# Patient Record
Sex: Male | Born: 1975 | Race: White | Hispanic: No | Marital: Married | Smoking: Never smoker
Health system: Southern US, Community
[De-identification: ages and names within clinical notes are randomized; demographics above are authoritative.]

## PROBLEM LIST (undated history)

## (undated) DIAGNOSIS — K219 Gastro-esophageal reflux disease without esophagitis: Secondary | ICD-10-CM

## (undated) DIAGNOSIS — M109 Gout, unspecified: Secondary | ICD-10-CM

## (undated) HISTORY — PX: VASECTOMY: SHX75

---

## 2016-09-05 DIAGNOSIS — M25511 Pain in right shoulder: Secondary | ICD-10-CM | POA: Diagnosis not present

## 2016-09-05 DIAGNOSIS — M545 Low back pain: Secondary | ICD-10-CM | POA: Diagnosis not present

## 2016-09-05 DIAGNOSIS — M546 Pain in thoracic spine: Secondary | ICD-10-CM | POA: Diagnosis not present

## 2016-09-05 DIAGNOSIS — E669 Obesity, unspecified: Secondary | ICD-10-CM | POA: Diagnosis not present

## 2016-10-03 DIAGNOSIS — M25411 Effusion, right shoulder: Secondary | ICD-10-CM | POA: Diagnosis not present

## 2016-10-03 DIAGNOSIS — M25511 Pain in right shoulder: Secondary | ICD-10-CM | POA: Diagnosis not present

## 2016-12-12 DIAGNOSIS — M2061 Acquired deformities of toe(s), unspecified, right foot: Secondary | ICD-10-CM | POA: Diagnosis not present

## 2016-12-12 DIAGNOSIS — M79674 Pain in right toe(s): Secondary | ICD-10-CM | POA: Diagnosis not present

## 2017-01-23 DIAGNOSIS — K029 Dental caries, unspecified: Secondary | ICD-10-CM | POA: Diagnosis not present

## 2017-03-06 DIAGNOSIS — F17290 Nicotine dependence, other tobacco product, uncomplicated: Secondary | ICD-10-CM | POA: Diagnosis not present

## 2017-03-06 DIAGNOSIS — M546 Pain in thoracic spine: Secondary | ICD-10-CM | POA: Diagnosis not present

## 2017-03-06 DIAGNOSIS — M25512 Pain in left shoulder: Secondary | ICD-10-CM | POA: Diagnosis not present

## 2017-03-06 DIAGNOSIS — M545 Low back pain: Secondary | ICD-10-CM | POA: Diagnosis not present

## 2017-04-20 DIAGNOSIS — R05 Cough: Secondary | ICD-10-CM | POA: Diagnosis not present

## 2017-04-20 DIAGNOSIS — R509 Fever, unspecified: Secondary | ICD-10-CM | POA: Diagnosis not present

## 2017-04-20 DIAGNOSIS — J018 Other acute sinusitis: Secondary | ICD-10-CM | POA: Diagnosis not present

## 2017-04-20 DIAGNOSIS — J069 Acute upper respiratory infection, unspecified: Secondary | ICD-10-CM | POA: Diagnosis not present

## 2017-05-01 DIAGNOSIS — M545 Low back pain: Secondary | ICD-10-CM | POA: Diagnosis not present

## 2017-05-01 DIAGNOSIS — G8929 Other chronic pain: Secondary | ICD-10-CM | POA: Diagnosis not present

## 2017-05-29 DIAGNOSIS — M545 Low back pain: Secondary | ICD-10-CM | POA: Diagnosis not present

## 2017-06-12 DIAGNOSIS — M2560 Stiffness of unspecified joint, not elsewhere classified: Secondary | ICD-10-CM | POA: Diagnosis not present

## 2017-06-12 DIAGNOSIS — M545 Low back pain: Secondary | ICD-10-CM | POA: Diagnosis not present

## 2017-06-15 DIAGNOSIS — M109 Gout, unspecified: Secondary | ICD-10-CM | POA: Diagnosis not present

## 2017-06-25 DIAGNOSIS — M6281 Muscle weakness (generalized): Secondary | ICD-10-CM | POA: Diagnosis not present

## 2017-06-25 DIAGNOSIS — M545 Low back pain: Secondary | ICD-10-CM | POA: Diagnosis not present

## 2017-07-20 DIAGNOSIS — K0889 Other specified disorders of teeth and supporting structures: Secondary | ICD-10-CM | POA: Diagnosis not present

## 2017-09-04 DIAGNOSIS — M25512 Pain in left shoulder: Secondary | ICD-10-CM | POA: Diagnosis not present

## 2017-09-04 DIAGNOSIS — Z832 Family history of diseases of the blood and blood-forming organs and certain disorders involving the immune mechanism: Secondary | ICD-10-CM | POA: Diagnosis not present

## 2017-09-04 DIAGNOSIS — Z1211 Encounter for screening for malignant neoplasm of colon: Secondary | ICD-10-CM | POA: Diagnosis not present

## 2017-09-04 DIAGNOSIS — M109 Gout, unspecified: Secondary | ICD-10-CM | POA: Diagnosis not present

## 2017-09-04 DIAGNOSIS — F4310 Post-traumatic stress disorder, unspecified: Secondary | ICD-10-CM | POA: Diagnosis not present

## 2018-01-14 DIAGNOSIS — K047 Periapical abscess without sinus: Secondary | ICD-10-CM | POA: Diagnosis not present

## 2018-03-03 DIAGNOSIS — Z113 Encounter for screening for infections with a predominantly sexual mode of transmission: Secondary | ICD-10-CM | POA: Diagnosis not present

## 2018-03-25 DIAGNOSIS — M25511 Pain in right shoulder: Secondary | ICD-10-CM | POA: Diagnosis not present

## 2018-03-25 DIAGNOSIS — Z832 Family history of diseases of the blood and blood-forming organs and certain disorders involving the immune mechanism: Secondary | ICD-10-CM | POA: Diagnosis not present

## 2018-03-25 DIAGNOSIS — Z9852 Vasectomy status: Secondary | ICD-10-CM | POA: Diagnosis not present

## 2018-03-25 DIAGNOSIS — Z3141 Encounter for fertility testing: Secondary | ICD-10-CM | POA: Diagnosis not present

## 2018-03-30 DIAGNOSIS — M25511 Pain in right shoulder: Secondary | ICD-10-CM | POA: Diagnosis not present

## 2018-03-30 DIAGNOSIS — Z832 Family history of diseases of the blood and blood-forming organs and certain disorders involving the immune mechanism: Secondary | ICD-10-CM | POA: Diagnosis not present

## 2018-04-09 DIAGNOSIS — H47233 Glaucomatous optic atrophy, bilateral: Secondary | ICD-10-CM | POA: Diagnosis not present

## 2018-04-09 DIAGNOSIS — H527 Unspecified disorder of refraction: Secondary | ICD-10-CM | POA: Diagnosis not present

## 2018-05-12 DIAGNOSIS — M7551 Bursitis of right shoulder: Secondary | ICD-10-CM | POA: Diagnosis not present

## 2018-05-12 DIAGNOSIS — M7541 Impingement syndrome of right shoulder: Secondary | ICD-10-CM | POA: Diagnosis not present

## 2018-06-07 DIAGNOSIS — H524 Presbyopia: Secondary | ICD-10-CM | POA: Diagnosis not present

## 2018-06-14 DIAGNOSIS — F4310 Post-traumatic stress disorder, unspecified: Secondary | ICD-10-CM | POA: Diagnosis not present

## 2018-06-21 DIAGNOSIS — R918 Other nonspecific abnormal finding of lung field: Secondary | ICD-10-CM | POA: Diagnosis not present

## 2018-06-21 DIAGNOSIS — E8801 Alpha-1-antitrypsin deficiency: Secondary | ICD-10-CM | POA: Diagnosis not present

## 2018-08-10 DIAGNOSIS — L247 Irritant contact dermatitis due to plants, except food: Secondary | ICD-10-CM | POA: Diagnosis not present

## 2018-09-03 DIAGNOSIS — M25512 Pain in left shoulder: Secondary | ICD-10-CM | POA: Diagnosis not present

## 2018-09-03 DIAGNOSIS — R918 Other nonspecific abnormal finding of lung field: Secondary | ICD-10-CM | POA: Diagnosis not present

## 2018-09-03 DIAGNOSIS — K219 Gastro-esophageal reflux disease without esophagitis: Secondary | ICD-10-CM | POA: Diagnosis not present

## 2018-09-03 DIAGNOSIS — E8801 Alpha-1-antitrypsin deficiency: Secondary | ICD-10-CM | POA: Diagnosis not present

## 2018-11-30 DIAGNOSIS — M109 Gout, unspecified: Secondary | ICD-10-CM | POA: Diagnosis not present

## 2019-01-05 DIAGNOSIS — H9313 Tinnitus, bilateral: Secondary | ICD-10-CM | POA: Diagnosis not present

## 2019-01-18 ENCOUNTER — Encounter (HOSPITAL_COMMUNITY): Payer: Self-pay | Admitting: Emergency Medicine

## 2019-01-18 ENCOUNTER — Emergency Department (HOSPITAL_COMMUNITY): Payer: No Typology Code available for payment source

## 2019-01-18 ENCOUNTER — Other Ambulatory Visit: Payer: Self-pay

## 2019-01-18 ENCOUNTER — Emergency Department (HOSPITAL_COMMUNITY)
Admission: EM | Admit: 2019-01-18 | Discharge: 2019-01-18 | Disposition: A | Discharge status: Home / Self Care | Payer: No Typology Code available for payment source | Attending: Emergency Medicine | Admitting: Emergency Medicine

## 2019-01-18 DIAGNOSIS — Z87891 Personal history of nicotine dependence: Secondary | ICD-10-CM | POA: Insufficient documentation

## 2019-01-18 DIAGNOSIS — R109 Unspecified abdominal pain: Secondary | ICD-10-CM | POA: Diagnosis present

## 2019-01-18 DIAGNOSIS — N200 Calculus of kidney: Secondary | ICD-10-CM | POA: Diagnosis not present

## 2019-01-18 DIAGNOSIS — N132 Hydronephrosis with renal and ureteral calculous obstruction: Secondary | ICD-10-CM | POA: Diagnosis not present

## 2019-01-18 LAB — URINALYSIS, ROUTINE W REFLEX MICROSCOPIC
Bacteria, UA: NONE SEEN
Bilirubin Urine: NEGATIVE
Glucose, UA: NEGATIVE mg/dL
Ketones, ur: NEGATIVE mg/dL
Leukocytes,Ua: NEGATIVE
Nitrite: NEGATIVE
Protein, ur: NEGATIVE mg/dL
Specific Gravity, Urine: 1.017 (ref 1.005–1.030)
pH: 6 (ref 5.0–8.0)

## 2019-01-18 LAB — CBC WITH DIFFERENTIAL/PLATELET
Abs Immature Granulocytes: 0.07 10*3/uL (ref 0.00–0.07)
Basophils Absolute: 0.1 10*3/uL (ref 0.0–0.1)
Basophils Relative: 1 %
Eosinophils Absolute: 0 10*3/uL (ref 0.0–0.5)
Eosinophils Relative: 0 %
HCT: 44.8 % (ref 39.0–52.0)
Hemoglobin: 15 g/dL (ref 13.0–17.0)
Immature Granulocytes: 1 %
Lymphocytes Relative: 9 %
Lymphs Abs: 1 10*3/uL (ref 0.7–4.0)
MCH: 28 pg (ref 26.0–34.0)
MCHC: 33.5 g/dL (ref 30.0–36.0)
MCV: 83.7 fL (ref 80.0–100.0)
Monocytes Absolute: 0.5 10*3/uL (ref 0.1–1.0)
Monocytes Relative: 5 %
Neutro Abs: 9.6 10*3/uL — ABNORMAL HIGH (ref 1.7–7.7)
Neutrophils Relative %: 84 %
Platelets: 220 10*3/uL (ref 150–400)
RBC: 5.35 MIL/uL (ref 4.22–5.81)
RDW: 12.2 % (ref 11.5–15.5)
WBC: 11.3 10*3/uL — ABNORMAL HIGH (ref 4.0–10.5)
nRBC: 0 % (ref 0.0–0.2)

## 2019-01-18 LAB — BASIC METABOLIC PANEL
Anion gap: 9 (ref 5–15)
BUN: 14 mg/dL (ref 6–20)
CO2: 22 mmol/L (ref 22–32)
Calcium: 9.1 mg/dL (ref 8.9–10.3)
Chloride: 108 mmol/L (ref 98–111)
Creatinine, Ser: 1.36 mg/dL — ABNORMAL HIGH (ref 0.61–1.24)
GFR calc Af Amer: >60 mL/min (ref >60)
GFR calc non Af Amer: >60 mL/min (ref >60)
Glucose, Bld: 114 mg/dL — ABNORMAL HIGH (ref 70–99)
Potassium: 4.2 mmol/L (ref 3.5–5.1)
Sodium: 139 mmol/L (ref 135–145)

## 2019-01-18 MED ORDER — KETOROLAC TROMETHAMINE 15 MG/ML IJ SOLN
15.0000 mg | Freq: Once | INTRAMUSCULAR | Status: AC
  Administered 2019-01-18: 16:00:00 15 mg via INTRAVENOUS
  Filled 2019-01-18: qty 1

## 2019-01-18 MED ORDER — MORPHINE SULFATE (PF) 4 MG/ML IV SOLN
4.0000 mg | Freq: Once | INTRAVENOUS | Status: AC
  Administered 2019-01-18: 14:00:00 4 mg via INTRAVENOUS
  Filled 2019-01-18: qty 1

## 2019-01-18 MED ORDER — OXYCODONE-ACETAMINOPHEN 5-325 MG PO TABS: 1.0000 | ORAL_TABLET | ORAL | 0 refills | Status: DC | PRN

## 2019-01-18 MED ORDER — ONDANSETRON HCL 4 MG/2ML IJ SOLN
4.0000 mg | Freq: Once | INTRAMUSCULAR | Status: AC
  Administered 2019-01-18: 4 mg via INTRAVENOUS
  Filled 2019-01-18: qty 2

## 2019-01-18 NOTE — ED Provider Notes (Signed)
Patient signed to me by Dr. Maryan Rued pending results and shows left-sided stone.  Patient's pain is controlled this time.  Will discharge home and give urology referral   Lacretia Leigh, MD 01/18/19 [phone]

## 2019-01-18 NOTE — ED Provider Notes (Signed)
[facility] DEPT Provider Note   CSN: [id number] Arrival date & time: 01/18/19  1059     History   Chief Complaint No chief complaint on file.   HPI Randall Sanders is a 43 y.o. male.     The history is provided by the patient.  Flank Pain This is a new problem. The current episode started 3 to 5 hours ago. The problem occurs constantly. The problem has not changed since onset.Associated symptoms include abdominal pain. Associated symptoms comments: Left flank pain that started suddenly at 9:30 and radiates around to the groin.  Dry heaving but no fever, vomiting or urinary complaints.  No resp complaints.  No hx of prior . Nothing aggravates the symptoms. Nothing relieves the symptoms. He has tried nothing for the symptoms. The treatment provided no relief.    History reviewed. No pertinent past medical history.  There are no active problems to display for this patient.   Past Surgical History:  Procedure Laterality Date  . VASECTOMY          Home Medications    Prior to Admission medications   Not on File    Family History No family history on file.  Social History Social History   Tobacco Use  . Smoking status: Not on file  . Smokeless tobacco: Former Network engineer Use Topics  . Alcohol use: Not Currently  . Drug use: Not on file     Allergies   Patient has no known allergies.   Review of Systems Review of Systems  Gastrointestinal: Positive for abdominal pain.  Genitourinary: Positive for flank pain.  All other systems reviewed and are negative.    Physical Exam Updated Vital Signs BP 131/62   Pulse 60   Temp 97.9 F (36.6 C) (Oral)   Resp (!) 24   SpO2 100%   Physical Exam Vitals signs and nursing note reviewed.  Constitutional:      General: He is not in acute distress.    Appearance: He is well-developed.  HENT:     Head: Normocephalic and atraumatic.  Eyes:     Conjunctiva/sclera:  Conjunctivae normal.     Pupils: Pupils are equal, round, and reactive to light.  Neck:     Musculoskeletal: Normal range of motion and neck supple.  Cardiovascular:     Rate and Rhythm: Normal rate and regular rhythm.     Heart sounds: No murmur.  Pulmonary:     Effort: Pulmonary effort is normal. No respiratory distress.     Breath sounds: Normal breath sounds. No wheezing or rales.  Abdominal:     General: There is no distension.     Palpations: Abdomen is soft.     Tenderness: There is no abdominal tenderness. There is no right CVA tenderness, left CVA tenderness, guarding or rebound.  Musculoskeletal: Normal range of motion.        General: No tenderness.  Skin:    General: Skin is warm and dry.     Findings: No erythema or rash.  Neurological:     General: No focal deficit present.     Mental Status: He is alert and oriented to person, place, and time. Mental status is at baseline.  Psychiatric:        Mood and Affect: Mood normal.        Behavior: Behavior normal.        Thought Content: Thought content normal.      ED Treatments / Results  Labs (all labs ordered are listed, but only abnormal results are displayed) Labs Reviewed  CBC WITH DIFFERENTIAL/PLATELET - Abnormal; Notable for the following components:      Result Value   WBC 11.3 (*)    Neutro Abs 9.6 (*)    All other components within normal limits  BASIC METABOLIC PANEL - Abnormal; Notable for the following components:   Glucose, Bld 114 (*)    Creatinine, Ser 1.36 (*)    All other components within normal limits  URINALYSIS, ROUTINE W REFLEX MICROSCOPIC    EKG None  Radiology No results found.  Procedures Procedures (including critical care time)  Medications Ordered in ED Medications  morphine 4 MG/ML injection 4 mg (has no administration in time range)  ondansetron (ZOFRAN) injection 4 mg (has no administration in time range)     Initial Impression / Assessment and Plan / ED Course  I  have reviewed the triage vital signs and the nursing notes.  Pertinent labs & imaging results that were available during my care of the patient were reviewed by me and considered in my medical decision making (see chart for details).        Pt with symptoms consistent with kidney stone.  Denies infectious sx, or GI symptoms.  Low concern for diverticulitis and no risk factors or history suggestive of AAA.  No hx suggestive of GU source (discharge) and otherwise pt is healthy.  Will hydrate, treat pain and ensure no infection with UA, CBC, BMP and will get stone study to further eval.    Final Clinical Impressions(s) / ED Diagnoses   Final diagnoses:  None    ED Discharge Orders    None       Gwyneth Sprout, MD 01/18/19 1545

## 2019-01-18 NOTE — ED Notes (Signed)
Patient transported to CT 

## 2019-01-18 NOTE — ED Notes (Signed)
Pt c/o flank pains

## 2019-02-07 DIAGNOSIS — R112 Nausea with vomiting, unspecified: Secondary | ICD-10-CM | POA: Diagnosis not present

## 2019-02-07 DIAGNOSIS — N132 Hydronephrosis with renal and ureteral calculous obstruction: Secondary | ICD-10-CM | POA: Diagnosis not present

## 2019-02-10 DIAGNOSIS — R918 Other nonspecific abnormal finding of lung field: Secondary | ICD-10-CM | POA: Diagnosis not present

## 2019-03-26 DIAGNOSIS — N400 Enlarged prostate without lower urinary tract symptoms: Secondary | ICD-10-CM | POA: Diagnosis not present

## 2019-03-26 DIAGNOSIS — R1032 Left lower quadrant pain: Secondary | ICD-10-CM | POA: Diagnosis not present

## 2019-03-26 DIAGNOSIS — R1012 Left upper quadrant pain: Secondary | ICD-10-CM | POA: Diagnosis not present

## 2019-03-26 DIAGNOSIS — N132 Hydronephrosis with renal and ureteral calculous obstruction: Secondary | ICD-10-CM | POA: Diagnosis not present

## 2019-03-26 DIAGNOSIS — N200 Calculus of kidney: Secondary | ICD-10-CM | POA: Diagnosis not present

## 2019-05-26 DIAGNOSIS — R110 Nausea: Secondary | ICD-10-CM | POA: Diagnosis not present

## 2019-05-26 DIAGNOSIS — N200 Calculus of kidney: Secondary | ICD-10-CM | POA: Diagnosis not present

## 2019-05-26 DIAGNOSIS — R109 Unspecified abdominal pain: Secondary | ICD-10-CM | POA: Diagnosis not present

## 2019-05-30 DIAGNOSIS — N200 Calculus of kidney: Secondary | ICD-10-CM | POA: Diagnosis not present

## 2019-05-30 DIAGNOSIS — R109 Unspecified abdominal pain: Secondary | ICD-10-CM | POA: Diagnosis not present

## 2019-05-31 DIAGNOSIS — N200 Calculus of kidney: Secondary | ICD-10-CM | POA: Diagnosis not present

## 2019-06-03 DIAGNOSIS — N201 Calculus of ureter: Secondary | ICD-10-CM | POA: Diagnosis not present

## 2019-06-21 DIAGNOSIS — N200 Calculus of kidney: Secondary | ICD-10-CM | POA: Diagnosis not present

## 2019-09-02 DIAGNOSIS — M109 Gout, unspecified: Secondary | ICD-10-CM | POA: Diagnosis not present

## 2019-10-11 DIAGNOSIS — R918 Other nonspecific abnormal finding of lung field: Secondary | ICD-10-CM | POA: Diagnosis not present

## 2019-10-11 DIAGNOSIS — M25512 Pain in left shoulder: Secondary | ICD-10-CM | POA: Diagnosis not present

## 2019-10-11 DIAGNOSIS — E8801 Alpha-1-antitrypsin deficiency: Secondary | ICD-10-CM | POA: Diagnosis not present

## 2019-10-11 DIAGNOSIS — R6882 Decreased libido: Secondary | ICD-10-CM | POA: Diagnosis not present

## 2019-10-21 DIAGNOSIS — Z13220 Encounter for screening for lipoid disorders: Secondary | ICD-10-CM | POA: Diagnosis not present

## 2019-10-21 DIAGNOSIS — M109 Gout, unspecified: Secondary | ICD-10-CM | POA: Diagnosis not present

## 2019-10-21 DIAGNOSIS — Z131 Encounter for screening for diabetes mellitus: Secondary | ICD-10-CM | POA: Diagnosis not present

## 2019-10-21 DIAGNOSIS — R6882 Decreased libido: Secondary | ICD-10-CM | POA: Diagnosis not present

## 2019-11-01 DIAGNOSIS — R109 Unspecified abdominal pain: Secondary | ICD-10-CM | POA: Diagnosis not present

## 2019-11-01 DIAGNOSIS — Z79899 Other long term (current) drug therapy: Secondary | ICD-10-CM | POA: Diagnosis not present

## 2019-11-01 DIAGNOSIS — K4090 Unilateral inguinal hernia, without obstruction or gangrene, not specified as recurrent: Secondary | ICD-10-CM | POA: Diagnosis not present

## 2019-11-01 DIAGNOSIS — Z87891 Personal history of nicotine dependence: Secondary | ICD-10-CM | POA: Diagnosis not present

## 2019-11-01 DIAGNOSIS — N201 Calculus of ureter: Secondary | ICD-10-CM | POA: Diagnosis not present

## 2019-11-01 DIAGNOSIS — N132 Hydronephrosis with renal and ureteral calculous obstruction: Secondary | ICD-10-CM | POA: Diagnosis not present

## 2019-11-01 DIAGNOSIS — Z887 Allergy status to serum and vaccine status: Secondary | ICD-10-CM | POA: Diagnosis not present

## 2019-12-29 DIAGNOSIS — M7989 Other specified soft tissue disorders: Secondary | ICD-10-CM | POA: Diagnosis not present

## 2019-12-29 DIAGNOSIS — M79671 Pain in right foot: Secondary | ICD-10-CM | POA: Diagnosis not present

## 2020-01-05 DIAGNOSIS — M79671 Pain in right foot: Secondary | ICD-10-CM | POA: Diagnosis not present

## 2020-01-30 DIAGNOSIS — M79671 Pain in right foot: Secondary | ICD-10-CM | POA: Diagnosis not present

## 2020-02-01 DIAGNOSIS — M79671 Pain in right foot: Secondary | ICD-10-CM | POA: Diagnosis not present

## 2020-02-06 DIAGNOSIS — M79671 Pain in right foot: Secondary | ICD-10-CM | POA: Diagnosis not present

## 2020-03-08 DIAGNOSIS — R918 Other nonspecific abnormal finding of lung field: Secondary | ICD-10-CM | POA: Diagnosis not present

## 2020-03-13 DIAGNOSIS — M79671 Pain in right foot: Secondary | ICD-10-CM | POA: Diagnosis not present

## 2020-03-22 DIAGNOSIS — M79671 Pain in right foot: Secondary | ICD-10-CM | POA: Diagnosis not present

## 2020-04-12 DIAGNOSIS — M79671 Pain in right foot: Secondary | ICD-10-CM | POA: Diagnosis not present

## 2020-04-12 DIAGNOSIS — H6123 Impacted cerumen, bilateral: Secondary | ICD-10-CM | POA: Diagnosis not present

## 2020-04-12 DIAGNOSIS — K219 Gastro-esophageal reflux disease without esophagitis: Secondary | ICD-10-CM | POA: Diagnosis not present

## 2020-04-12 DIAGNOSIS — K30 Functional dyspepsia: Secondary | ICD-10-CM | POA: Diagnosis not present

## 2020-05-20 DIAGNOSIS — N200 Calculus of kidney: Secondary | ICD-10-CM | POA: Diagnosis not present

## 2020-05-22 DIAGNOSIS — R1032 Left lower quadrant pain: Secondary | ICD-10-CM | POA: Diagnosis not present

## 2020-05-22 DIAGNOSIS — N201 Calculus of ureter: Secondary | ICD-10-CM | POA: Diagnosis not present

## 2020-05-22 DIAGNOSIS — R1084 Generalized abdominal pain: Secondary | ICD-10-CM | POA: Diagnosis not present

## 2020-05-22 DIAGNOSIS — N1330 Unspecified hydronephrosis: Secondary | ICD-10-CM | POA: Diagnosis not present

## 2020-05-28 DIAGNOSIS — H524 Presbyopia: Secondary | ICD-10-CM | POA: Diagnosis not present

## 2020-06-22 ENCOUNTER — Telehealth: Payer: Self-pay | Admitting: Urology

## 2020-06-22 NOTE — Telephone Encounter (Signed)
Returned call from patient's wife. Patient had left USE/LL and ureteral stent placement with Dr. Alvester Morin today at the surgical center. Per patient's wife, the patient wasn't sent home with any medications, and he is having what sounds likely bladder spasms.   I sent in an Rx for oxybutynin 5mg  TID PRN for spasms. I asked Dr. to send in a narcotic Rx. I recommended that the patient alternate between tylenol and ibuprofen as well as use a heating pad for pain; then use narcotics for breakthrough.

## 2020-07-17 DIAGNOSIS — M25512 Pain in left shoulder: Secondary | ICD-10-CM | POA: Diagnosis not present

## 2020-07-17 DIAGNOSIS — G8929 Other chronic pain: Secondary | ICD-10-CM | POA: Diagnosis not present

## 2020-07-17 DIAGNOSIS — M25511 Pain in right shoulder: Secondary | ICD-10-CM | POA: Diagnosis not present

## 2020-08-07 DIAGNOSIS — M19011 Primary osteoarthritis, right shoulder: Secondary | ICD-10-CM | POA: Diagnosis not present

## 2020-08-22 DIAGNOSIS — H524 Presbyopia: Secondary | ICD-10-CM | POA: Diagnosis not present

## 2020-09-12 DIAGNOSIS — M75111 Incomplete rotator cuff tear or rupture of right shoulder, not specified as traumatic: Secondary | ICD-10-CM | POA: Diagnosis not present

## 2020-09-12 DIAGNOSIS — S43431A Superior glenoid labrum lesion of right shoulder, initial encounter: Secondary | ICD-10-CM | POA: Diagnosis not present

## 2020-10-11 DIAGNOSIS — M79671 Pain in right foot: Secondary | ICD-10-CM | POA: Diagnosis not present

## 2020-10-11 DIAGNOSIS — S43401A Unspecified sprain of right shoulder joint, initial encounter: Secondary | ICD-10-CM | POA: Diagnosis not present

## 2020-10-11 DIAGNOSIS — M25562 Pain in left knee: Secondary | ICD-10-CM | POA: Diagnosis not present

## 2020-10-11 DIAGNOSIS — M75111 Incomplete rotator cuff tear or rupture of right shoulder, not specified as traumatic: Secondary | ICD-10-CM | POA: Diagnosis not present

## 2020-10-12 DIAGNOSIS — M25562 Pain in left knee: Secondary | ICD-10-CM | POA: Diagnosis not present

## 2020-10-12 DIAGNOSIS — S76102A Unspecified injury of left quadriceps muscle, fascia and tendon, initial encounter: Secondary | ICD-10-CM | POA: Diagnosis not present

## 2020-10-12 DIAGNOSIS — M7551 Bursitis of right shoulder: Secondary | ICD-10-CM | POA: Diagnosis not present

## 2020-10-12 DIAGNOSIS — Z712 Person consulting for explanation of examination or test findings: Secondary | ICD-10-CM | POA: Diagnosis not present

## 2020-10-12 DIAGNOSIS — M79671 Pain in right foot: Secondary | ICD-10-CM | POA: Diagnosis not present

## 2020-10-12 DIAGNOSIS — M75111 Incomplete rotator cuff tear or rupture of right shoulder, not specified as traumatic: Secondary | ICD-10-CM | POA: Diagnosis not present

## 2020-10-12 DIAGNOSIS — S43431A Superior glenoid labrum lesion of right shoulder, initial encounter: Secondary | ICD-10-CM | POA: Diagnosis not present

## 2020-10-22 DIAGNOSIS — M75111 Incomplete rotator cuff tear or rupture of right shoulder, not specified as traumatic: Secondary | ICD-10-CM | POA: Diagnosis not present

## 2020-10-22 DIAGNOSIS — S4991XS Unspecified injury of right shoulder and upper arm, sequela: Secondary | ICD-10-CM | POA: Diagnosis not present

## 2020-10-26 DIAGNOSIS — F6381 Intermittent explosive disorder: Secondary | ICD-10-CM | POA: Diagnosis not present

## 2020-11-06 DIAGNOSIS — R0681 Apnea, not elsewhere classified: Secondary | ICD-10-CM | POA: Diagnosis not present

## 2020-11-06 DIAGNOSIS — R0683 Snoring: Secondary | ICD-10-CM | POA: Diagnosis not present

## 2020-11-06 DIAGNOSIS — R519 Headache, unspecified: Secondary | ICD-10-CM | POA: Diagnosis not present

## 2020-12-05 DIAGNOSIS — M5187 Other intervertebral disc disorders, lumbosacral region: Secondary | ICD-10-CM | POA: Diagnosis not present

## 2020-12-05 DIAGNOSIS — M47816 Spondylosis without myelopathy or radiculopathy, lumbar region: Secondary | ICD-10-CM | POA: Diagnosis not present

## 2020-12-05 DIAGNOSIS — M545 Low back pain, unspecified: Secondary | ICD-10-CM | POA: Diagnosis not present

## 2020-12-27 DIAGNOSIS — G4733 Obstructive sleep apnea (adult) (pediatric): Secondary | ICD-10-CM | POA: Diagnosis not present

## 2021-01-09 DIAGNOSIS — Z6839 Body mass index (BMI) 39.0-39.9, adult: Secondary | ICD-10-CM | POA: Diagnosis not present

## 2021-01-09 DIAGNOSIS — Z72821 Inadequate sleep hygiene: Secondary | ICD-10-CM | POA: Diagnosis not present

## 2021-01-09 DIAGNOSIS — G4733 Obstructive sleep apnea (adult) (pediatric): Secondary | ICD-10-CM | POA: Diagnosis not present

## 2021-01-09 DIAGNOSIS — E669 Obesity, unspecified: Secondary | ICD-10-CM | POA: Diagnosis not present

## 2021-01-16 DIAGNOSIS — M25562 Pain in left knee: Secondary | ICD-10-CM | POA: Diagnosis not present

## 2021-01-29 DIAGNOSIS — S62307A Unspecified fracture of fifth metacarpal bone, left hand, initial encounter for closed fracture: Secondary | ICD-10-CM | POA: Diagnosis not present

## 2021-01-29 DIAGNOSIS — X500XXA Overexertion from strenuous movement or load, initial encounter: Secondary | ICD-10-CM | POA: Diagnosis not present

## 2021-01-29 DIAGNOSIS — S6992XA Unspecified injury of left wrist, hand and finger(s), initial encounter: Secondary | ICD-10-CM | POA: Diagnosis not present

## 2021-02-25 DIAGNOSIS — Z1211 Encounter for screening for malignant neoplasm of colon: Secondary | ICD-10-CM | POA: Diagnosis not present

## 2021-09-16 ENCOUNTER — Encounter: Payer: Self-pay | Admitting: Neurology

## 2021-09-16 ENCOUNTER — Other Ambulatory Visit: Payer: Self-pay

## 2021-09-16 DIAGNOSIS — R202 Paresthesia of skin: Secondary | ICD-10-CM

## 2021-09-20 ENCOUNTER — Other Ambulatory Visit: Payer: Self-pay | Admitting: Orthopedic Surgery

## 2021-09-20 ENCOUNTER — Other Ambulatory Visit (HOSPITAL_COMMUNITY): Payer: Self-pay | Admitting: Orthopedic Surgery

## 2021-09-20 DIAGNOSIS — M79603 Pain in arm, unspecified: Secondary | ICD-10-CM

## 2021-09-20 DIAGNOSIS — R531 Weakness: Secondary | ICD-10-CM

## 2021-09-20 DIAGNOSIS — M436 Torticollis: Secondary | ICD-10-CM

## 2021-09-20 DIAGNOSIS — R200 Anesthesia of skin: Secondary | ICD-10-CM

## 2021-09-28 ENCOUNTER — Ambulatory Visit (HOSPITAL_COMMUNITY)
Admission: RE | Admit: 2021-09-28 | Discharge: 2021-09-28 | Disposition: A | Discharge status: Home / Self Care | Payer: No Typology Code available for payment source | Source: Ambulatory Visit | Attending: Orthopedic Surgery | Admitting: Orthopedic Surgery

## 2021-09-28 DIAGNOSIS — M79603 Pain in arm, unspecified: Secondary | ICD-10-CM | POA: Diagnosis not present

## 2021-09-28 DIAGNOSIS — R531 Weakness: Secondary | ICD-10-CM | POA: Diagnosis present

## 2021-09-28 DIAGNOSIS — R200 Anesthesia of skin: Secondary | ICD-10-CM | POA: Diagnosis present

## 2021-09-28 DIAGNOSIS — M436 Torticollis: Secondary | ICD-10-CM | POA: Diagnosis present

## 2021-10-27 IMAGING — CT CT RENAL STONE PROTOCOL
2 of 4 series · 16 of 46 positions shown, 18 images · non-contrast
Comparison: None.

CLINICAL DATA: Flank pain

EXAM:
CT ABDOMEN AND PELVIS WITHOUT CONTRAST
TECHNIQUE: Multidetector CT imaging of the abdomen and pelvis was performed
following the standard protocol without IV contrast.

[Series 2: axial st · axial · 0.97mm/px · z∈[-442,+83]mm · 13 of 121 slices shown, 15 images]
[im 8/121  soft-tissue]
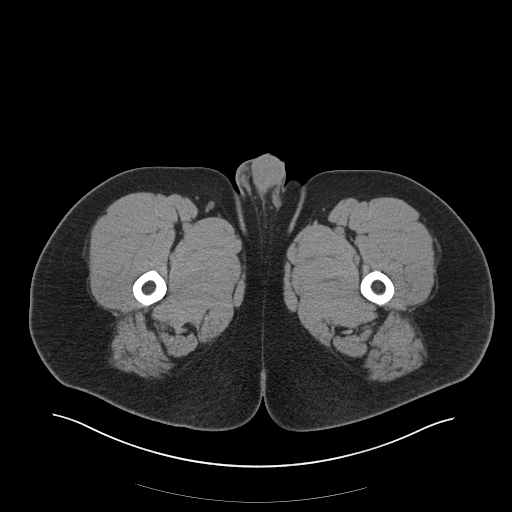
[im 8/121  bone]
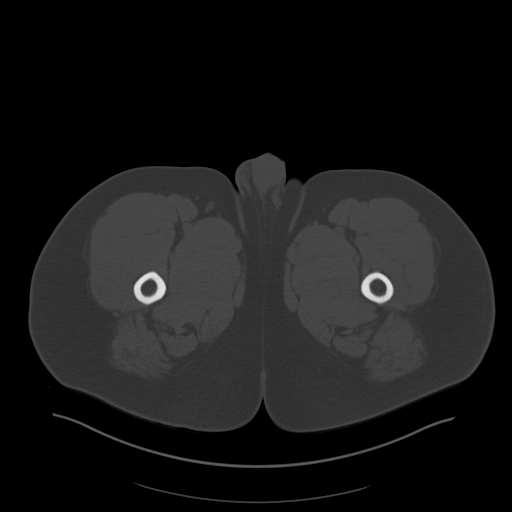
[im 16/121  soft-tissue]
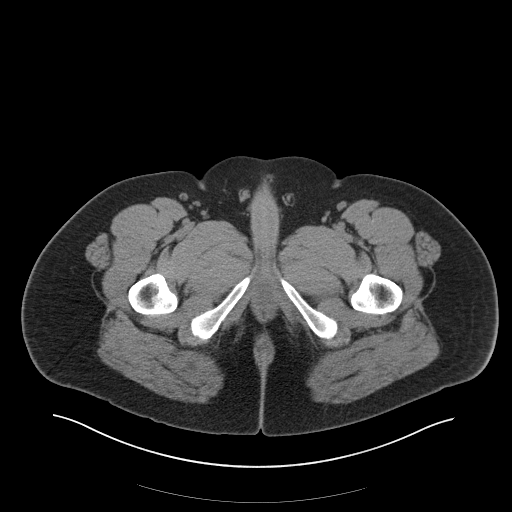
[im 23/121  soft-tissue]
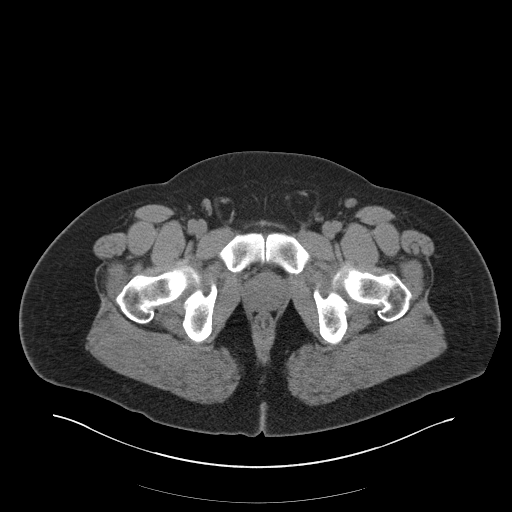
[im 38/121  soft-tissue]
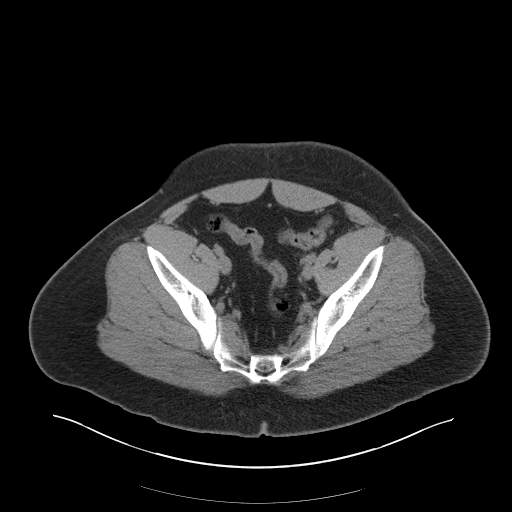
[im 46/121  soft-tissue]
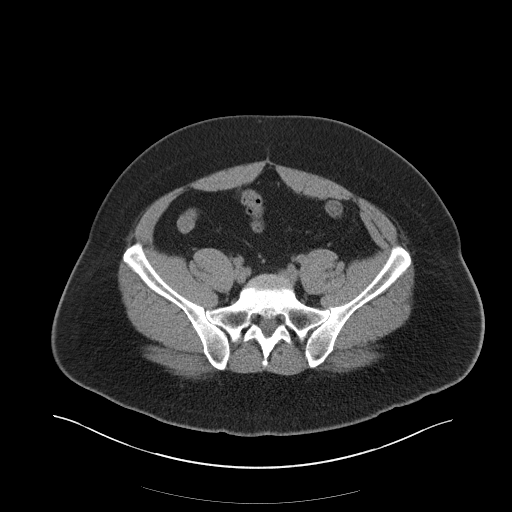
[im 53/121  soft-tissue]
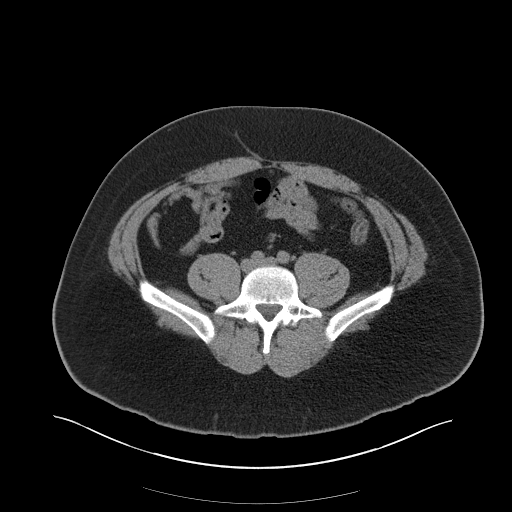
[im 61/121  soft-tissue]
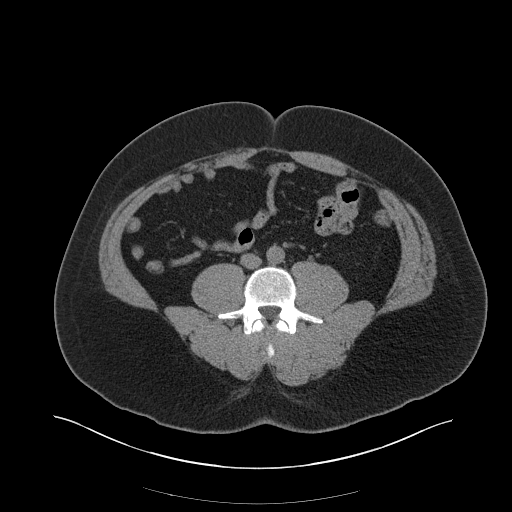
[im 68/121  soft-tissue]
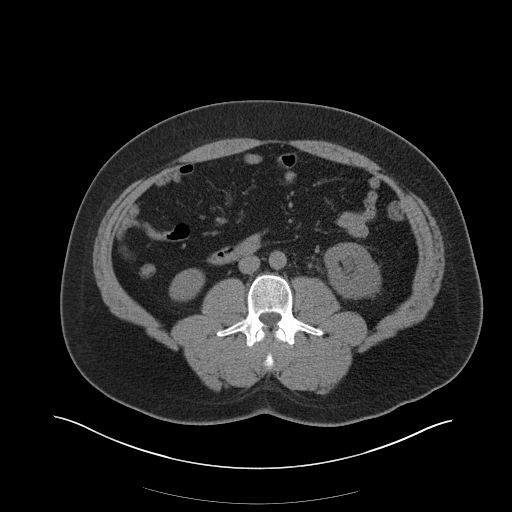
[im 76/121  soft-tissue]
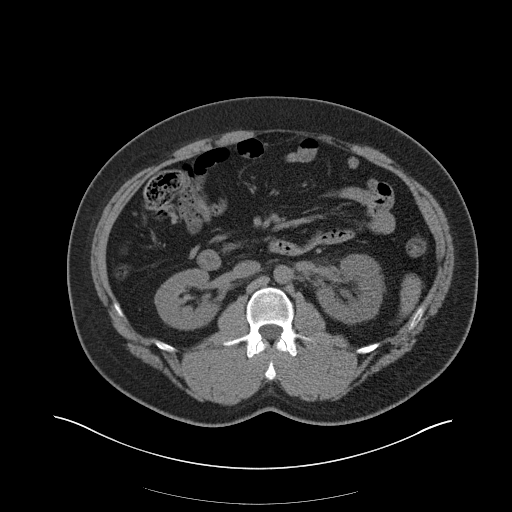
[im 76/121  bone]
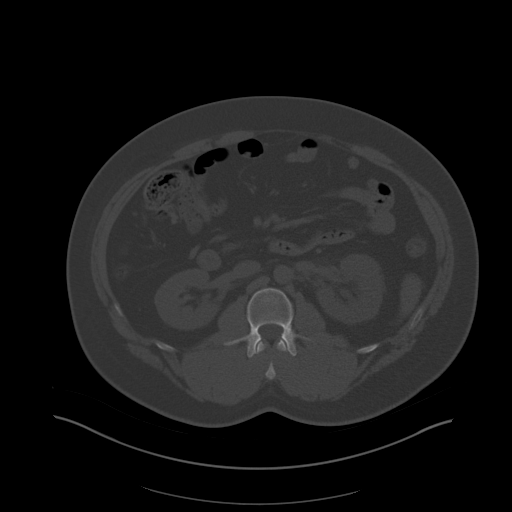
[im 83/121  soft-tissue]
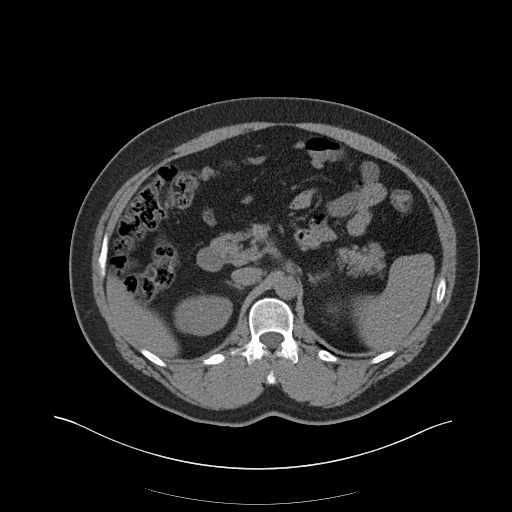
[im 98/121  soft-tissue]
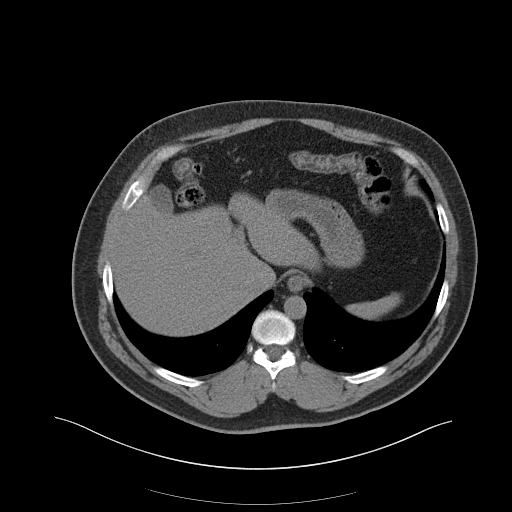
[im 106/121  soft-tissue]
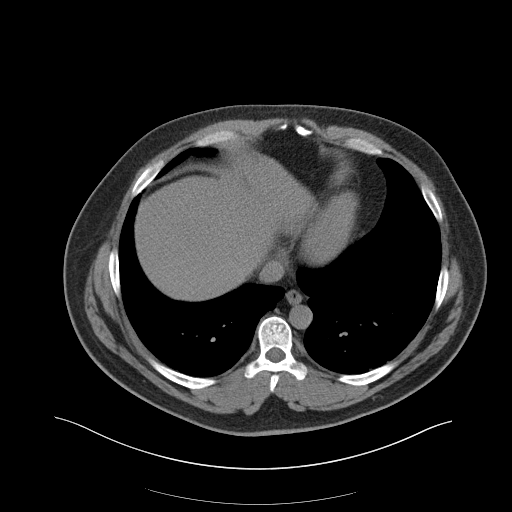
[im 113/121  soft-tissue]
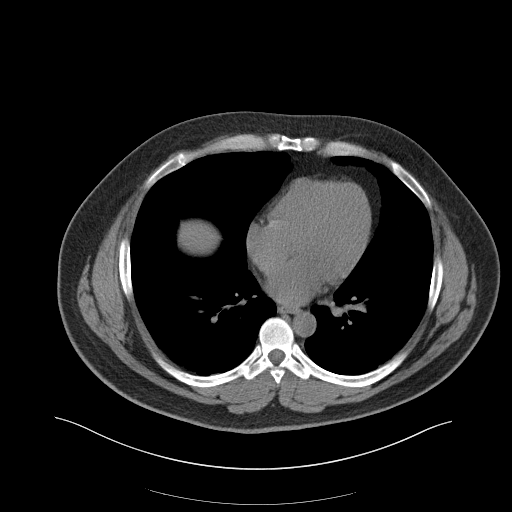

[Series 4: coronal · coronal · 0.85mm/px · 3 of 173 slices shown]
[im 58/173  soft-tissue]
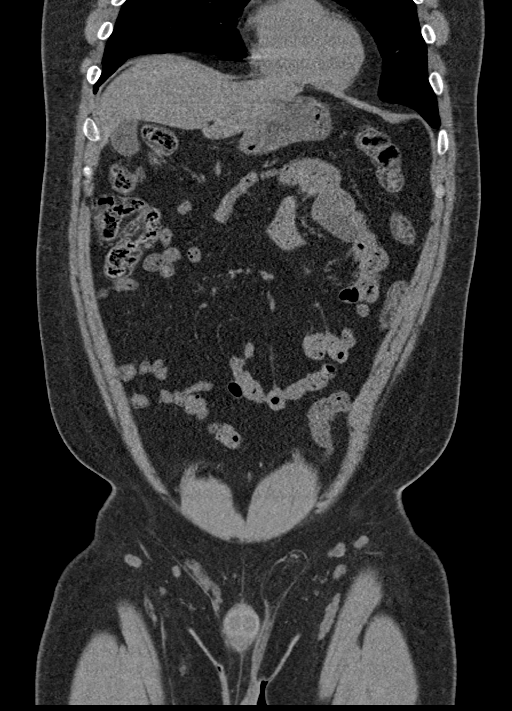
[im 77/173  soft-tissue]
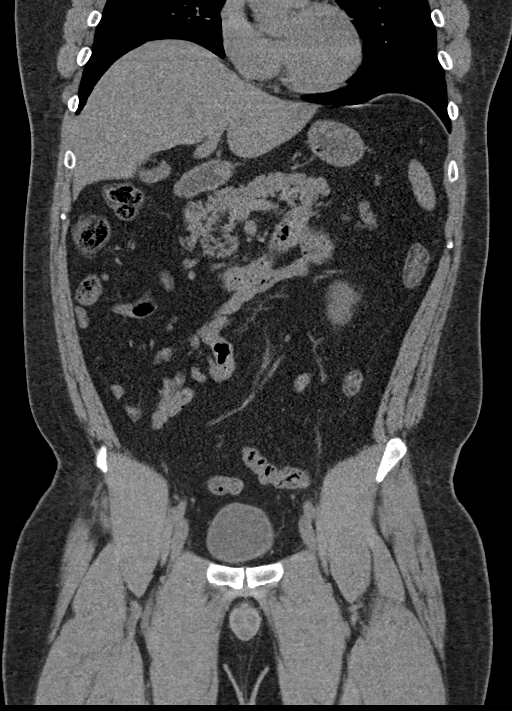
[im 96/173  soft-tissue]
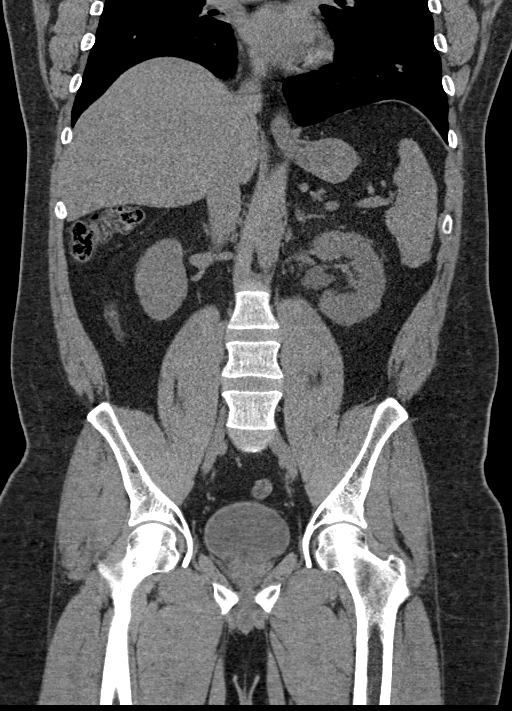

[16 of 46 positions shown; findings below may reference images not displayed]

FINDINGS: Lower chest: No acute abnormality.  Dependent atelectasis.

Hepatobiliary: No focal hepatic abnormality. Gallbladder
unremarkable.

Pancreas: No focal abnormality or ductal dilatation.

Spleen: No focal abnormality.  Normal size.

Adrenals/Urinary Tract: There is mild to moderate left
hydronephrosis and perinephric stranding. 2-3 mm proximal left
ureteral stone noted. No renal or ureteral stones on the right.
Adrenal glands and urinary bladder unremarkable.

Stomach/Bowel: Stomach, large and small bowel grossly unremarkable.

Vascular/Lymphatic: No evidence of aneurysm or adenopathy.

Reproductive: No visible focal abnormality.

Other: No free fluid or free air. Small left inguinal hernia
containing fat.

Musculoskeletal: No acute bony abnormality.
IMPRESSION: 2-3 mm proximal left ureteral stone with mild to moderate left
hydronephrosis and perinephric stranding.

## 2021-11-04 ENCOUNTER — Other Ambulatory Visit: Payer: Self-pay

## 2021-11-04 ENCOUNTER — Encounter (HOSPITAL_BASED_OUTPATIENT_CLINIC_OR_DEPARTMENT_OTHER): Payer: Self-pay | Admitting: Emergency Medicine

## 2021-11-04 DIAGNOSIS — X509XXA Other and unspecified overexertion or strenuous movements or postures, initial encounter: Secondary | ICD-10-CM | POA: Diagnosis not present

## 2021-11-04 DIAGNOSIS — M25521 Pain in right elbow: Secondary | ICD-10-CM | POA: Diagnosis present

## 2021-11-04 DIAGNOSIS — Y990 Civilian activity done for income or pay: Secondary | ICD-10-CM | POA: Diagnosis not present

## 2021-11-04 NOTE — ED Triage Notes (Signed)
Patient states he was pulling something at work and felt a pop behind his right elbow, states his right 4th and 5th finger went numb and now he just has pain down his arm.

## 2021-11-05 ENCOUNTER — Emergency Department (HOSPITAL_BASED_OUTPATIENT_CLINIC_OR_DEPARTMENT_OTHER)
Admission: EM | Admit: 2021-11-05 | Discharge: 2021-11-05 | Disposition: A | Discharge status: Home / Self Care | Payer: No Typology Code available for payment source | Attending: Emergency Medicine | Admitting: Emergency Medicine

## 2021-11-05 DIAGNOSIS — M25521 Pain in right elbow: Secondary | ICD-10-CM

## 2021-11-05 HISTORY — DX: Gout, unspecified: M10.9

## 2021-11-05 HISTORY — DX: Gastro-esophageal reflux disease without esophagitis: K21.9

## 2021-11-05 MED ORDER — IBUPROFEN 800 MG PO TABS
800.0000 mg | ORAL_TABLET | Freq: Once | ORAL | Status: AC
  Administered 2021-11-05: 800 mg via ORAL
  Filled 2021-11-05: qty 1

## 2021-11-05 MED ORDER — IBUPROFEN 600 MG PO TABS: 600.0000 mg | ORAL_TABLET | Freq: Four times a day (QID) | ORAL | 0 refills | Status: AC | PRN

## 2021-11-05 NOTE — ED Notes (Signed)
ED Provider at bedside. 

## 2021-11-05 NOTE — ED Provider Notes (Signed)
   [facility] EMERGENCY DEPARTMENT  Provider Note  CSN: [id number] Arrival date & time: 11/04/21 2223  History Chief Complaint  Patient presents with   Arm Pain    Randall Sanders is a 46 y.o. 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 No falls or injuries. He has had similar symptoms in the past, had an outpatient c-spine MRI in July that was neg for stenosis.    Home Medications Prior to Admission medications   Medication Sig Start Date End Date Taking? Authorizing Provider  ibuprofen (ADVIL) 600 MG tablet Take 1 tablet (600 mg total) by mouth every 6 (six) hours as needed. 11/05/21  Yes Pollyann Savoy, MD     Allergies    Patient has no known allergies.   Review of Systems   Review of Systems Please see HPI for pertinent positives and negatives  Physical Exam BP (!) 122/56   Pulse 78   Temp 97.8 F (36.6 C) (Oral)   Resp 18   Ht 5\' 10"  (1.778 m)   Wt 115.2 kg   SpO2 95%   BMI 36.45 kg/m   Physical Exam Vitals and nursing note reviewed.  HENT:     Head: Normocephalic.     Nose: Nose normal.  Eyes:     Extraocular Movements: Extraocular movements intact.  Cardiovascular:     Pulses: Normal pulses.  Pulmonary:     Effort: Pulmonary effort is normal.  Musculoskeletal:        General: Tenderness (medial R elbow to light touch) present. No swelling, deformity or signs of injury. Normal range of motion.     Cervical back: Neck supple.  Skin:    Findings: No rash (on exposed skin).  Neurological:     Mental Status: He is alert and oriented to person, place, and time.  Psychiatric:        Mood and Affect: Mood normal.     ED Results / Procedures / Treatments   EKG None  Procedures Procedures  Medications Ordered in the ED Medications  ibuprofen (ADVIL)  tablet 800 mg (800 mg Oral Given 11/05/21 0223)    Initial Impression and Plan  Patient with medial R elbow pain and tingling in ulnar hand. No injury in need of imaging. Suspect medial epicondylitis vs ulnar nerve injury. Will place in splint. Motrin for pain. Recommend outpatient ortho follow up.   ED Course       MDM Rules/Calculators/A&P Medical Decision Making Problems Addressed: Right elbow pain: acute illness or injury  Risk Prescription drug management.    Final Clinical Impression(s) / ED Diagnoses Final diagnoses:  Right elbow pain    Rx / DC Orders ED Discharge Orders          Ordered    ibuprofen (ADVIL) 600 MG tablet  Every 6 hours PRN        11/05/21 0233             11/07/21, MD 11/05/21 [phone]

## 2021-12-12 ENCOUNTER — Ambulatory Visit (INDEPENDENT_AMBULATORY_CARE_PROVIDER_SITE_OTHER): Payer: No Typology Code available for payment source | Admitting: Neurology

## 2021-12-12 DIAGNOSIS — R202 Paresthesia of skin: Secondary | ICD-10-CM

## 2021-12-12 NOTE — Procedures (Signed)
[facility]  [place], [place]  [place], [place] [address] Tel: [phone] Fax:  [phone] Test Date:  12/12/2021  Patient: Randall Sanders DOB: 1975/06/30 Physician: Narda Amber, DO  Sex: Male Height: 5\' 10"  Ref Phys: Alexzandrew L.Perkins,PA-C  ID#: [id number]   Technician:    Patient Complaints: This is a 46 year old man referred for evaluation of right arm paresthesias.  NCV & EMG Findings: Extensive electrodiagnostic testing of the right upper extremity shows:  Right median, ulnar, and mixed ulnar sensory responses are within normal. Right median and ulnar motor responses are within normal limits. There is no evidence of active or chronic motor axonal loss changes affecting any of the tested muscles.  Motor unit configuration and recruitment pattern is within normal limits.  Impression: This is a normal study of the right upper extremity.  In particular, there is no evidence of carpal tunnel syndrome, ulnar neuropathy, or cervical radiculopathy.   ___________________________ Narda Amber, DO    Nerve Conduction Studies Anti Sensory Summary Table   Stim Site NR Peak (ms) Norm Peak (ms) O-P Amp (V) Norm O-P Amp  Right Median Anti Sensory (2nd Digit)  33C  Wrist    2.9 <3.4 26.8 >20  Right Ulnar Anti Sensory (5th Digit)  33C  Wrist    2.6 <3.1 27.8 >12   Motor Summary Table   Stim Site NR Onset (ms) Norm Onset (ms) O-P Amp (mV) Norm O-P Amp Site1 Site2 Delta-0 (ms) Dist (cm) Vel (m/s) Norm Vel (m/s)  Right Median Motor (Abd Poll Brev)  33C  Wrist    2.8 <3.9 13.3 >6 Elbow Wrist 5.2 30.0 58 >50  Elbow    8.0  13.0         Right Ulnar Motor (Abd Dig Minimi)  33C  Wrist    2.3 <3.1 10.2 >7 B Elbow Wrist 3.5 21.0 60 >50  B Elbow    5.8  9.5  A Elbow B Elbow 1.5 10.0 67 >50  A Elbow    7.3  9.4          Comparison Summary Table   Stim Site NR Peak (ms) Norm Peak (ms) P-T Amp (V) Site1 Site2 Delta-P (ms) Norm Delta (ms)  Right  Median/Ulnar Palm Comparison (Wrist - 8cm)  33C  Median Palm    1.7 <2.2 64.4 Median Palm Ulnar Palm 0.1   Ulnar Palm    1.6 <2.2 13.6       EMG   Side Muscle Ins Act Fibs Fasc Recrt Dur. Amp. Poly. Activation Comment  Right 1stDorInt Nml Nml Nml Nml Nml Nml Nml Nml N/A  Right Ext Indicis Nml Nml Nml Nml Nml Nml Nml Nml N/A  Right PronatorTeres Nml Nml Nml Nml Nml Nml Nml Nml N/A  Right Biceps Nml Nml Nml Nml Nml Nml Nml Nml N/A  Right Triceps Nml Nml Nml Nml Nml Nml Nml Nml N/A  Right Deltoid Nml Nml Nml Nml Nml Nml Nml Nml N/A      Waveforms:

## 2024-02-20 ENCOUNTER — Encounter (HOSPITAL_COMMUNITY): Payer: Self-pay

## 2024-02-20 ENCOUNTER — Emergency Department (HOSPITAL_COMMUNITY)
Admission: EM | Admit: 2024-02-20 | Discharge: 2024-02-20 | Disposition: A | Discharge status: Home / Self Care | Attending: Emergency Medicine | Admitting: Emergency Medicine

## 2024-02-20 ENCOUNTER — Other Ambulatory Visit: Payer: Self-pay

## 2024-02-20 ENCOUNTER — Emergency Department (HOSPITAL_COMMUNITY)

## 2024-02-20 DIAGNOSIS — R079 Chest pain, unspecified: Secondary | ICD-10-CM | POA: Insufficient documentation

## 2024-02-20 LAB — TROPONIN I (HIGH SENSITIVITY)
Troponin I (High Sensitivity): 4 ng/L (ref <18)
Troponin I (High Sensitivity): 4 ng/L (ref <18)

## 2024-02-20 LAB — CBC
HCT: 41.8 % (ref 39.0–52.0)
Hemoglobin: 14.3 g/dL (ref 13.0–17.0)
MCH: 28.5 pg (ref 26.0–34.0)
MCHC: 34.2 g/dL (ref 30.0–36.0)
MCV: 83.4 fL (ref 80.0–100.0)
Platelets: 192 K/uL (ref 150–400)
RBC: 5.01 MIL/uL (ref 4.22–5.81)
RDW: 12.3 % (ref 11.5–15.5)
WBC: 7.1 K/uL (ref 4.0–10.5)
nRBC: 0 % (ref 0.0–0.2)

## 2024-02-20 LAB — BASIC METABOLIC PANEL WITH GFR
Anion gap: 10 (ref 5–15)
BUN: 9 mg/dL (ref 6–20)
CO2: 21 mmol/L — ABNORMAL LOW (ref 22–32)
Calcium: 8.9 mg/dL (ref 8.9–10.3)
Chloride: 106 mmol/L (ref 98–111)
Creatinine, Ser: 1.23 mg/dL (ref 0.61–1.24)
GFR, Estimated: >60 mL/min (ref >60)
Glucose, Bld: 119 mg/dL — ABNORMAL HIGH (ref 70–99)
Potassium: 3.8 mmol/L (ref 3.5–5.1)
Sodium: 137 mmol/L (ref 135–145)

## 2024-02-20 LAB — D-DIMER, QUANTITATIVE: D-Dimer, Quant: <0.27 ug{FEU}/mL (ref 0.00–0.50)

## 2024-02-20 MED ORDER — NITROGLYCERIN 0.4 MG SL SUBL
0.4000 mg | SUBLINGUAL_TABLET | SUBLINGUAL | Status: DC | PRN
  Administered 2024-02-20: 0.4 mg via SUBLINGUAL
  Filled 2024-02-20: qty 1

## 2024-02-20 NOTE — ED Provider Notes (Signed)
 [facility] EMERGENCY DEPARTMENT AT [facility] Provider Note   CSN: [id number] Arrival date & time: 02/20/24  9987     Patient presents with: Chest Pain   Randall Sanders is a 48 y.o. male.  Patient with past medical history significant for gout, acid reflux, hyperlipidemia presents to the emergency department via EMS complaining of left-sided chest pain which began 1 hour prior to arrival.  He states he was under a Christmas tree working on lights when he began to have sudden onset left-sided chest pain described as a heaviness.  He states that it feels that an elephant is sitting on his chest.  He does endorse some numbness in the left hand along with mild dyspnea and nausea with no emesis.  During transport EMS administered 324 mg of aspirin, 4 mg of Zofran , and 2 doses of sublingual nitroglycerin .  Upon arrival patient was found to have an oxygen saturation of 88% on room air and was put on supplemental oxygen.  Patient currently denies abdominal pain, recent travel or surgery.  No first-degree family history of heart attack but patient does endorse his father having CHF and his brother who is a few years older than him having surgeries for valve repair.    Chest Pain      Prior to Admission medications   Medication Sig Start Date End Date Taking? Authorizing Provider  ibuprofen  (ADVIL ) 600 MG tablet Take 1 tablet (600 mg total) by mouth every 6 (six) hours as needed. 11/05/21   Roselyn Carlin NOVAK, MD    Allergies: Patient has no known allergies.    Review of Systems  Cardiovascular:  Positive for chest pain.    Updated Vital Signs BP 122/75   Pulse 63   Temp 98.4 F (36.9 C) (Oral)   Resp 10   Ht 5' 10 (1.778 m)   Wt 131.5 kg   SpO2 98%   BMI 41.61 kg/m   Physical Exam Vitals and nursing note reviewed.  Constitutional:      General: He is not in acute distress.    Appearance: He is well-developed.  HENT:     Head: Normocephalic and atraumatic.  Eyes:      Conjunctiva/sclera: Conjunctivae normal.  Cardiovascular:     Rate and Rhythm: Normal rate and regular rhythm.  Pulmonary:     Effort: Pulmonary effort is normal. No respiratory distress.     Breath sounds: Normal breath sounds.  Chest:     Chest wall: No tenderness.  Abdominal:     Palpations: Abdomen is soft.     Tenderness: There is no abdominal tenderness.  Musculoskeletal:        General: No swelling.     Cervical back: Neck supple.     Right lower leg: No edema.     Left lower leg: No edema.  Skin:    General: Skin is warm and dry.     Capillary Refill: Capillary refill takes less than 2 seconds.  Neurological:     Mental Status: He is alert.  Psychiatric:        Mood and Affect: Mood normal.     (all labs ordered are listed, but only abnormal results are displayed) Labs Reviewed  BASIC METABOLIC PANEL WITH GFR - Abnormal; Notable for the following components:      Result Value   CO2 21 (*)    Glucose, Bld 119 (*)    All other components within normal limits  CBC  D-DIMER, QUANTITATIVE  TROPONIN I (HIGH SENSITIVITY)  TROPONIN I (HIGH SENSITIVITY)    EKG: EKG Interpretation Date/Time:  Saturday February 20 2024 00:17:50 EST Ventricular Rate:  82 PR Interval:  201 QRS Duration:  94 QT Interval:  386 QTC Calculation: 451 R Axis:   46  Text Interpretation: Sinus rhythm Minimal ST depression, lateral leads No old tracing to compare Confirmed by Trine Likes [phone]) on 02/20/2024 12:18:56 AM  Radiology: DG Chest Portable 1 View Result Date: 02/20/2024 EXAM: 1 VIEW(S) XRAY OF THE CHEST 02/20/2024 01:04:00 AM COMPARISON: None available. CLINICAL HISTORY: Chest Pain FINDINGS: LUNGS AND PLEURA: Low lung volumes with crowding of pulmonary vasculature. Hazy opacity at left lung base laterally, suggesting subsegmental atelectasis or early pneumonia. No pleural effusion. No pneumothorax. HEART AND MEDIASTINUM: No acute abnormality of the cardiac and mediastinal  silhouettes. BONES AND SOFT TISSUES: No acute osseous abnormality. IMPRESSION: 1. Hazy opacity at the left lung base laterally, which may reflect subsegmental atelectasis versus early pneumonia. Electronically signed by: Norman Gatlin MD 02/20/2024 01:15 AM EST RP Workstation: [id number]     Procedures   Medications Ordered in the ED  nitroGLYCERIN (NITROSTAT) SL tablet 0.4 mg (0.4 mg Sublingual Given 02/20/24 0026)                                    Medical Decision Making Amount and/or Complexity of Data Reviewed Labs: ordered. Radiology: ordered.  Risk Prescription drug management.   This patient presents to the ED for concern of chest pain, this involves an extensive number of treatment options, and is a complaint that carries with it a high risk of complications and morbidity.  The differential diagnosis includes ACS, pneumonia, pulmonary embolism, GERD, aortic dissection, musculoskeletal pain, others   Co morbidities / Chronic conditions that complicate the patient evaluation  Hyperlipidemia, gout   Additional history obtained:  Additional history obtained from EMR, EMS, and family   Lab Tests:  I Ordered, and personally interpreted labs.  The pertinent results include: Initial troponin 4 with no change on repeat lab draw, D-dimer less than 0.27   Imaging Studies ordered:  I ordered imaging studies including chest x-ray I independently visualized and interpreted imaging which showed  Hazy opacity at the left lung base laterally, which may reflect subsegmental  atelectasis versus early pneumonia.   I agree with the radiologist interpretation   Cardiac Monitoring: / EKG:  The patient was maintained on a cardiac monitor.  I personally viewed and interpreted the cardiac monitored which showed an underlying rhythm of: Sinus rhythm   Problem List / ED Course / Critical interventions / Medication management   I ordered medication including nitroglycerin    Reevaluation of the patient after these medicines showed that the patient improved I have reviewed the patients home medicines and have made adjustments as needed    Social Determinants of Health:  Patient gets his health care through the [employer] / Admission - Considered:  Unclear as to what caused patient's earlier pain.  His pain has dissipated at this time.  His vitals are reassuring.  He has negative troponins x 2 and a nonischemic EKG.  Does not appear consistent with ACS this evening.  He had a negative D-dimer showing no clot burden.  No suspicion at this time of PE.  Symptoms not consistent with aortic dissection.  Patient is normotensive at this time and describes no ripping or tearing chest  pain.  Symptoms also do not seem consistent with GERD.  He did have the brief period of oxygen desaturation which also has an unclear cause.  He has been ambulated in the unit with no supplemental oxygen and maintain oxygen saturations of 95+ percent and had no shortness of breath during ambulation.  At this time I feel the patient would benefit from outpatient follow-up.  I offered to refer the patient to cardiology but he states that [facility] will [facility] to [facility] a [place] cardiologist appointment for follow-up.  I provided the patient with return precautions including worsening chest pain or shortness of breath and recommended prompt evaluation at an emergency department if he developed either symptom.  Patient voices understanding of plan.  Stable for discharge home at this time.      Final diagnoses:  Chest pain, unspecified type    ED Discharge Orders     None          Logan Ubaldo KATHEE DEVONNA 02/20/24 0343    Trine Raynell Moder, MD 02/20/24 [phone]

## 2024-02-20 NOTE — Discharge Instructions (Signed)
 Your workup this evening was reassuring.  As discussed I strongly recommend following up with a cardiologist for outpatient evaluation.  [facility] to get an appointment scheduled.  If you have worsening chest pain or develop shortness of breath or other life-threatening symptoms please seek prompt evaluation in the emergency department.

## 2024-02-20 NOTE — ED Notes (Signed)
 Patient ambulated in hallway with pulse ox. Patient maintained 96-99% on room, HR WNL, steady gait and denies any complaints throughout ambulation trial.

## 2024-02-20 NOTE — ED Notes (Signed)
 Lab called to add on Ddimer.

## 2024-02-20 NOTE — ED Triage Notes (Addendum)
 Patient BIB [facility] from home due to chest pain x1 hours. Patient was decorating for Christmas about an hour ago and had sudden onset L sided CP (non-radiating). No hx of chest pain. Endorses SOB and nausea. VSS w/ EMS. Patient received 2 nitroglycerin, 324 aspirin, 4mg  zofran .
